# Patient Record
Sex: Female | Born: 1955 | Race: White | Hispanic: No | Smoking: Never smoker
Health system: Southern US, Community
[De-identification: ages and names within clinical notes are randomized; demographics above are authoritative.]

## PROBLEM LIST (undated history)

## (undated) DIAGNOSIS — I2510 Atherosclerotic heart disease of native coronary artery without angina pectoris: Secondary | ICD-10-CM

## (undated) DIAGNOSIS — E785 Hyperlipidemia, unspecified: Secondary | ICD-10-CM

## (undated) HISTORY — DX: Hyperlipidemia, unspecified: E78.5

## (undated) HISTORY — DX: Atherosclerotic heart disease of native coronary artery without angina pectoris: I25.10

## (undated) HISTORY — PX: TUMOR REMOVAL: SHX12

## (undated) HISTORY — PX: TONSILLECTOMY: SHX5217

---

## 1997-11-30 ENCOUNTER — Other Ambulatory Visit: Admission: RE | Admit: 1997-11-30 | Discharge: 1997-11-30 | Payer: Self-pay | Admitting: Obstetrics and Gynecology

## 1998-01-17 ENCOUNTER — Other Ambulatory Visit: Admission: RE | Admit: 1998-01-17 | Discharge: 1998-01-17 | Payer: Self-pay | Admitting: Obstetrics and Gynecology

## 2001-03-30 ENCOUNTER — Encounter: Payer: Self-pay | Admitting: Family Medicine

## 2001-03-30 ENCOUNTER — Encounter: Admission: RE | Admit: 2001-03-30 | Discharge: 2001-03-30 | Payer: Self-pay | Admitting: Family Medicine

## 2004-04-12 ENCOUNTER — Encounter: Admission: RE | Admit: 2004-04-12 | Discharge: 2004-04-12 | Payer: Self-pay | Admitting: Family Medicine

## 2004-04-29 ENCOUNTER — Ambulatory Visit (HOSPITAL_COMMUNITY): Admission: RE | Admit: 2004-04-29 | Discharge: 2004-04-29 | Payer: Self-pay | Admitting: *Deleted

## 2004-04-29 ENCOUNTER — Encounter (INDEPENDENT_AMBULATORY_CARE_PROVIDER_SITE_OTHER): Payer: Self-pay | Admitting: *Deleted

## 2005-07-22 ENCOUNTER — Other Ambulatory Visit: Admission: RE | Admit: 2005-07-22 | Discharge: 2005-07-22 | Payer: Self-pay | Admitting: *Deleted

## 2005-07-29 ENCOUNTER — Encounter: Admission: RE | Admit: 2005-07-29 | Discharge: 2005-07-29 | Payer: Self-pay | Admitting: Family Medicine

## 2005-08-01 ENCOUNTER — Encounter: Admission: RE | Admit: 2005-08-01 | Discharge: 2005-08-01 | Payer: Self-pay | Admitting: Family Medicine

## 2007-03-24 ENCOUNTER — Encounter: Admission: RE | Admit: 2007-03-24 | Discharge: 2007-03-24 | Payer: Self-pay | Admitting: Family Medicine

## 2007-04-30 ENCOUNTER — Encounter: Admission: RE | Admit: 2007-04-30 | Discharge: 2007-04-30 | Payer: Self-pay | Admitting: Family Medicine

## 2008-07-20 ENCOUNTER — Encounter: Admission: RE | Admit: 2008-07-20 | Discharge: 2008-07-20 | Payer: Self-pay | Admitting: Family Medicine

## 2010-08-16 ENCOUNTER — Other Ambulatory Visit: Payer: Self-pay | Admitting: Family Medicine

## 2010-08-16 DIAGNOSIS — Z1231 Encounter for screening mammogram for malignant neoplasm of breast: Secondary | ICD-10-CM

## 2010-08-20 ENCOUNTER — Ambulatory Visit
Admission: RE | Admit: 2010-08-20 | Discharge: 2010-08-20 | Disposition: A | Discharge status: Home / Self Care | Payer: BC Managed Care – PPO | Source: Ambulatory Visit | Attending: Family Medicine | Admitting: Family Medicine

## 2010-08-20 DIAGNOSIS — Z1231 Encounter for screening mammogram for malignant neoplasm of breast: Secondary | ICD-10-CM

## 2011-12-11 ENCOUNTER — Other Ambulatory Visit: Payer: Self-pay | Admitting: Orthopedic Surgery

## 2011-12-11 DIAGNOSIS — M25531 Pain in right wrist: Secondary | ICD-10-CM

## 2011-12-18 ENCOUNTER — Other Ambulatory Visit: Payer: BC Managed Care – PPO

## 2011-12-22 ENCOUNTER — Ambulatory Visit
Admission: RE | Admit: 2011-12-22 | Discharge: 2011-12-22 | Disposition: A | Discharge status: Home / Self Care | Payer: BC Managed Care – PPO | Source: Ambulatory Visit | Attending: Orthopedic Surgery | Admitting: Orthopedic Surgery

## 2011-12-22 DIAGNOSIS — M25531 Pain in right wrist: Secondary | ICD-10-CM

## 2011-12-22 MED ORDER — IOHEXOL 180 MG/ML  SOLN
3.0000 mL | Freq: Once | INTRAMUSCULAR | Status: AC | PRN
  Administered 2011-12-22: 3 mL via INTRA_ARTICULAR

## 2013-12-12 ENCOUNTER — Other Ambulatory Visit: Payer: Self-pay

## 2013-12-12 DIAGNOSIS — Z1231 Encounter for screening mammogram for malignant neoplasm of breast: Secondary | ICD-10-CM

## 2013-12-15 ENCOUNTER — Ambulatory Visit: Payer: BC Managed Care – PPO

## 2014-10-19 ENCOUNTER — Ambulatory Visit
Admission: RE | Admit: 2014-10-19 | Discharge: 2014-10-19 | Disposition: A | Discharge status: Home / Self Care | Payer: BLUE CROSS/BLUE SHIELD | Source: Ambulatory Visit

## 2014-10-19 DIAGNOSIS — Z1231 Encounter for screening mammogram for malignant neoplasm of breast: Secondary | ICD-10-CM

## 2019-08-13 ENCOUNTER — Ambulatory Visit: Payer: BLUE CROSS/BLUE SHIELD

## 2019-08-18 ENCOUNTER — Ambulatory Visit: Payer: BLUE CROSS/BLUE SHIELD

## 2019-08-20 ENCOUNTER — Ambulatory Visit: Payer: BLUE CROSS/BLUE SHIELD

## 2019-08-21 ENCOUNTER — Ambulatory Visit: Payer: Medicare Other | Attending: Internal Medicine

## 2019-08-21 DIAGNOSIS — Z23 Encounter for immunization: Secondary | ICD-10-CM | POA: Insufficient documentation

## 2019-08-21 NOTE — Progress Notes (Signed)
   Covid-19 Vaccination Clinic  Name:  Lori Foley    MRN: [id number] DOB: 01-17-1956  08/21/2019  Lori Foley was observed post Covid-19 immunization for 15 minutes without incidence. She was provided with Vaccine Information Sheet and instruction to access the V-Safe system.   Lori Foley was instructed to call 911 with any severe reactions post vaccine: Marland Kitchen Difficulty breathing  . Swelling of your face and throat  . A fast heartbeat  . A bad rash all over your body  . Dizziness and weakness    Immunizations Administered    Name Date Dose VIS Date Route   Pfizer COVID-19 Vaccine 08/21/2019  8:30 AM 0.3 mL 06/24/2019 Intramuscular   Manufacturer: [employer], [employer]   Lot: [id number]   NDC: [address]-7

## 2019-09-14 ENCOUNTER — Ambulatory Visit: Payer: Medicare Other | Attending: Internal Medicine

## 2019-09-14 DIAGNOSIS — Z23 Encounter for immunization: Secondary | ICD-10-CM | POA: Insufficient documentation

## 2019-09-14 NOTE — Progress Notes (Signed)
   Covid-19 Vaccination Clinic  Name:  ACADIA THAMMAVONG    MRN: [id number] DOB: 10-Jul-1956  09/14/2019  Ms. Mcdowell was observed post Covid-19 immunization for 15 minutes without incident. She was provided with Vaccine Information Sheet and instruction to access the V-Safe system.   Ms. Magar was instructed to call 911 with any severe reactions post vaccine: Marland Kitchen Difficulty breathing  . Swelling of face and throat  . A fast heartbeat  . A bad rash all over body  . Dizziness and weakness   Immunizations Administered    Name Date Dose VIS Date Route   Pfizer COVID-19 Vaccine 09/14/2019  3:39 PM 0.3 mL 06/24/2019 Intramuscular   Manufacturer: [employer], [employer]   Lot: [id number]   NDC: [address]-7

## 2020-05-07 ENCOUNTER — Ambulatory Visit (INDEPENDENT_AMBULATORY_CARE_PROVIDER_SITE_OTHER): Payer: Medicare Other | Admitting: Cardiology

## 2020-05-07 ENCOUNTER — Other Ambulatory Visit: Payer: Self-pay

## 2020-05-07 ENCOUNTER — Encounter: Payer: Self-pay | Admitting: Cardiology

## 2020-05-07 VITALS — BP 130/70 | HR 68 | Ht 63.0 in | Wt 156.0 lb

## 2020-05-07 DIAGNOSIS — R079 Chest pain, unspecified: Secondary | ICD-10-CM | POA: Diagnosis not present

## 2020-05-07 DIAGNOSIS — R072 Precordial pain: Secondary | ICD-10-CM

## 2020-05-07 DIAGNOSIS — R0609 Other forms of dyspnea: Secondary | ICD-10-CM

## 2020-05-07 DIAGNOSIS — R0600 Dyspnea, unspecified: Secondary | ICD-10-CM

## 2020-05-07 DIAGNOSIS — E785 Hyperlipidemia, unspecified: Secondary | ICD-10-CM

## 2020-05-07 HISTORY — DX: Other forms of dyspnea: R06.09

## 2020-05-07 HISTORY — DX: Dyspnea, unspecified: R06.00

## 2020-05-07 HISTORY — DX: Precordial pain: R07.2

## 2020-05-07 HISTORY — DX: Hyperlipidemia, unspecified: E78.5

## 2020-05-07 MED ORDER — METOPROLOL TARTRATE 100 MG PO TABS: 100.0000 mg | ORAL_TABLET | Freq: Once | ORAL | 0 refills | Status: DC

## 2020-05-07 NOTE — Patient Instructions (Addendum)
Medication Instructions:  Your physician recommends that you continue on your current medications as directed. Please refer to the Current Medication list given to you today.  *If you need a refill on your cardiac medications before your next appointment, please call your pharmacy*   Lab Work: Your physician recommends that you return for lab work 3-7 days before ct : bmp   If you have labs (blood work) drawn today and your tests are completely normal, you will receive your results only by: Marland Kitchen MyChart Message (if you have MyChart) OR . A paper copy in the mail If you have any lab test that is abnormal or we need to change your treatment, we will call you to review the results.   Testing/Procedures:  Your physician has requested that you have an echocardiogram. Echocardiography is a painless test that uses sound waves to create images of your heart. It provides your doctor with information about the size and shape of your heart and how well your heart's chambers and valves are working. This procedure takes approximately one hour. There are no restrictions for this procedure.    Your cardiac CT will be scheduled at one of the below locations:   [facility] [address] [place], [place] [address] [phone]  [facility] [address] [place], [place] [address] [phone]  If scheduled at [facility], please arrive at the [facility] main entrance of [facility] 30 minutes prior to test start time. Proceed to the [facility] Radiology Department (first floor) to check-in and test prep.  If scheduled at [facility], please arrive 15 mins early for check-in and test prep.  Please follow these instructions carefully (unless otherwise directed):    On the Night Before the Test: . Be sure to Drink plenty of water. . Do not consume any caffeinated/decaffeinated beverages  or chocolate 12 hours prior to your test. . Do not take any antihistamines 12 hours prior to your test.   On the Day of the Test: . Drink plenty of water. Do not drink any water within one hour of the test. . Do not eat any food 4 hours prior to the test. . You may take your regular medications prior to the test.  . Take metoprolol (Lopressor) two hours prior to test. . FEMALES- please wear underwire-free bra if available         After the Test: . Drink plenty of water. . After receiving IV contrast, you may experience a mild flushed feeling. This is normal. . On occasion, you may experience a mild rash up to 24 hours after the test. This is not dangerous. If this occurs, you can take Benadryl 25 mg and increase your fluid intake. . If you experience trouble breathing, this can be serious. If it is severe call 911 IMMEDIATELY. If it is mild, please call our office. . If you take any of these medications: Glipizide/Metformin, Avandament, Glucavance, please do not take 48 hours after completing test unless otherwise instructed.   Once we have confirmed authorization from your insurance company, we will call you to set up a date and time for your test. Based on how quickly your insurance processes prior authorizations requests, please allow up to 4 weeks to be contacted for scheduling your Cardiac CT appointment. Be advised that routine Cardiac CT appointments could be scheduled as many as 8 weeks after your provider has ordered it.  For  non-scheduling related questions, please contact the cardiac imaging nurse navigator should you have any questions/concerns: Rockwell Alexandria, Cardiac Imaging Nurse Navigator Mitzi Hansen, Interim Cardiac Imaging Nurse Navigator [facility] Direct Office Dial: [phone]   For scheduling needs, including cancellations and rescheduling, please call Sheralyn Boatman at [phone], option [phone].      Follow-Up: At [facility], you and your  health needs are our priority.  As part of our continuing mission to provide you with exceptional heart care, we have created designated Provider Care Teams.  These Care Teams include your primary Cardiologist (physician) and Advanced Practice Providers (APPs -  Physician Assistants and Nurse Practitioners) who all work together to provide you with the care you need, when you need it.  We recommend signing up for the patient portal called "MyChart".  Sign up information is provided on this After Visit Summary.  MyChart is used to connect with patients for Virtual Visits (Telemedicine).  Patients are able to view lab/test results, encounter notes, upcoming appointments, etc.  Non-urgent messages can be sent to your provider as well.   To learn more about what you can do with MyChart, go to [email or web address].    Your next appointment:   6 week(s)  The format for your next appointment:   In Person  Provider:   Gypsy Balsam, MD   Other Instructions   Echocardiogram An echocardiogram is a procedure that uses painless sound waves (ultrasound) to produce an image of the heart. Images from an echocardiogram can provide important information about:  Signs of coronary artery disease (CAD).  Aneurysm detection. An aneurysm is a weak or damaged part of an artery wall that bulges out from the normal force of blood pumping through the body.  Heart size and shape. Changes in the size or shape of the heart can be associated with certain conditions, including heart failure, aneurysm, and CAD.  Heart muscle function.  Heart valve function.  Signs of a past heart attack.  Fluid buildup around the heart.  Thickening of the heart muscle.  A tumor or infectious growth around the heart valves. Tell a health care provider about:  Any allergies you have.  All medicines you are taking, including vitamins, herbs, eye drops, creams, and over-the-counter medicines.  Any blood disorders you  have.  Any surgeries you have had.  Any medical conditions you have.  Whether you are pregnant or may be pregnant. What are the risks? Generally, this is a safe procedure. However, problems may occur, including:  Allergic reaction to dye (contrast) that may be used during the procedure. What happens before the procedure? No specific preparation is needed. You may eat and drink normally. What happens during the procedure?   An IV tube may be inserted into one of your veins.  You may receive contrast through this tube. A contrast is an injection that improves the quality of the pictures from your heart.  A gel will be applied to your chest.  A wand-like tool (transducer) will be moved over your chest. The gel will help to transmit the sound waves from the transducer.  The sound waves will harmlessly bounce off of your heart to allow the heart images to be captured in real-time motion. The images will be recorded on a computer. The procedure may vary among health care providers and hospitals. What happens after the procedure?  You may return to your normal, everyday life, including diet, activities, and medicines, unless your health care provider tells you  not to do that. Summary  An echocardiogram is a procedure that uses painless sound waves (ultrasound) to produce an image of the heart.  Images from an echocardiogram can provide important information about the size and shape of your heart, heart muscle function, heart valve function, and fluid buildup around your heart.  You do not need to do anything to prepare before this procedure. You may eat and drink normally.  After the echocardiogram is completed, you may return to your normal, everyday life, unless your health care provider tells you not to do that. This information is not intended to replace advice given to you by your health care provider. Make sure you discuss any questions you have with your health care  provider. Document Revised: 10/21/2018 Document Reviewed: 08/02/2016 Elsevier Patient Education  [place].    Cardiac CT Angiogram A cardiac CT angiogram is a procedure to look at the heart and the area around the heart. It may be done to help find the cause of chest pains or other symptoms of heart disease. During this procedure, a substance called contrast dye is injected into the blood vessels in the area to be checked. A large X-ray machine, called a CT scanner, then takes detailed pictures of the heart and the surrounding area. The procedure is also sometimes called a coronary CT angiogram, coronary artery scanning, or CTA. A cardiac CT angiogram allows the health care provider to see how well blood is flowing to and from the heart. The health care provider will be able to see if there are any problems, such as:  Blockage or narrowing of the coronary arteries in the heart.  Fluid around the heart.  Signs of weakness or disease in the muscles, valves, and tissues of the heart. Tell a health care provider about:  Any allergies you have. This is especially important if you have had a previous allergic reaction to contrast dye.  All medicines you are taking, including vitamins, herbs, eye drops, creams, and over-the-counter medicines.  Any blood disorders you have.  Any surgeries you have had.  Any medical conditions you have.  Whether you are pregnant or may be pregnant.  Any anxiety disorders, chronic pain, or other conditions you have that may increase your stress or prevent you from lying still. What are the risks? Generally, this is a safe procedure. However, problems may occur, including:  Bleeding.  Infection.  Allergic reactions to medicines or dyes.  Damage to other structures or organs.  Kidney damage from the contrast dye that is used.  Increased risk of cancer from radiation exposure. This risk is low. Talk with your health care provider about: ? The  risks and benefits of testing. ? How you can receive the lowest dose of radiation. What happens before the procedure?  Wear comfortable clothing and remove any jewelry, glasses, dentures, and hearing aids.  Follow instructions from your health care provider about eating and drinking. This may include: ? For 12 hours before the procedure -- avoid caffeine. This includes tea, coffee, soda, energy drinks, and diet pills. Drink plenty of water or other fluids that do not have caffeine in them. Being well hydrated can prevent complications. ? For 4-6 hours before the procedure -- stop eating and drinking. The contrast dye can cause nausea, but this is less likely if your stomach is empty.  Ask your health care provider about changing or stopping your regular medicines. This is especially important if you are taking diabetes medicines, blood thinners,  or medicines to treat problems with erections (erectile dysfunction). What happens during the procedure?   Hair on your chest may need to be removed so that small sticky patches called electrodes can be placed on your chest. These will transmit information that helps to monitor your heart during the procedure.  An IV will be inserted into one of your veins.  You might be given a medicine to control your heart rate during the procedure. This will help to ensure that good images are obtained.  You will be asked to lie on an exam table. This table will slide in and out of the CT machine during the procedure.  Contrast dye will be injected into the IV. You might feel warm, or you may get a metallic taste in your mouth.  You will be given a medicine called nitroglycerin. This will relax or dilate the arteries in your heart.  The table that you are lying on will move into the CT machine tunnel for the scan.  The person running the machine will give you instructions while the scans are being done. You may be asked to: ? Keep your arms above your  head. ? Hold your breath. ? Stay very still, even if the table is moving.  When the scanning is complete, you will be moved out of the machine.  The IV will be removed. The procedure may vary among health care providers and hospitals. What can I expect after the procedure? After your procedure, it is common to have:  A metallic taste in your mouth from the contrast dye.  A feeling of warmth.  A headache from the nitroglycerin. Follow these instructions at home:  Take over-the-counter and prescription medicines only as told by your health care provider.  If you are told, drink enough fluid to keep your urine pale yellow. This will help to flush the contrast dye out of your body.  Most people can return to their normal activities right after the procedure. Ask your health care provider what activities are safe for you.  It is up to you to get the results of your procedure. Ask your health care provider, or the department that is doing the procedure, when your results will be ready.  Keep all follow-up visits as told by your health care provider. This is important. Contact a health care provider if:  You have any symptoms of allergy to the contrast dye. These include: ? Shortness of breath. ? Rash or hives. ? A racing heartbeat. Summary  A cardiac CT angiogram is a procedure to look at the heart and the area around the heart. It may be done to help find the cause of chest pains or other symptoms of heart disease.  During this procedure, a large X-ray machine, called a CT scanner, takes detailed pictures of the heart and the surrounding area after a contrast dye has been injected into blood vessels in the area.  Ask your health care provider about changing or stopping your regular medicines before the procedure. This is especially important if you are taking diabetes medicines, blood thinners, or medicines to treat erectile dysfunction.  If you are told, drink enough fluid to  keep your urine pale yellow. This will help to flush the contrast dye out of your body. This information is not intended to replace advice given to you by your health care provider. Make sure you discuss any questions you have with your health care provider. Document Revised: 02/23/2019 Document Reviewed: 02/23/2019 Elsevier Patient  Education  [employer].

## 2020-05-07 NOTE — Addendum Note (Signed)
Addended by: Hazle Quant on: 05/07/2020 09:38 AM   Modules accepted: Orders

## 2020-05-07 NOTE — Progress Notes (Signed)
Cardiology Consultation:    Date:  05/07/2020   ID:  Lori, Foley March 20, 1956, MRN [id number]  PCP:  Sigmund Hazel, MD  Cardiologist:  Gypsy Balsam, MD   Referring MD: No ref. provider found   No chief complaint on file. I have chest pain  History of Present Illness:    Lori Foley is a 64 y.o. female who is being seen today for the evaluation of chest pain at the request of No ref. provider found.  For years she experienced chest pain.  She loves to work in the garden when she goes there into some bushes of cleaning leaves after about an hour of doing this history becomes very weak tired exhausted and also described to have some tightness in the chest.  She has to go back home and relax about 15 minutes later she is ready to come back home the second time when she goes back she usually can work for about half an hour and the same standing happen when she goes back home.  This been going on for years but finally she got tired of this and she would like to know exactly what the problem is.  Interesting is the fact that she likes to walk on the treadmill she usually stay on the treadmill for about half an hour at a speed of 3 mph and she has absolutely no difficulty doing it.  With this sensation while she walks in the garden she does sweat some but she sweats because she works.  She also developed some shortness of breath but again she works physically so is difficult to judge.  She does have mild dyslipidemia, she never smoked.  She does not have hypertension diabetes.  She does not have family history of premature coronary artery disease.  So she does not have many risk factors for having heart issues.  Regardless symptoms are concerning need to be investigated.  Past Medical History:  Diagnosis Date  . Hyperlipidemia     Past Surgical History:  Procedure Laterality Date  . TONSILLECTOMY    . TUMOR REMOVAL Right    RT HAND    Current Medications: Current Meds  Medication  Sig  . Magnesium 250 MG TABS Take 1 tablet by mouth daily.  . Multiple Vitamins-Minerals (CENTRUM SILVER ADULT 50+) TABS Take 1 tablet by mouth daily.  . vitamin C (ASCORBIC ACID) 250 MG tablet Take 250 mg by mouth daily.     Allergies:   Patient has no known allergies.   Social History   Socioeconomic History  . Marital status: Married    Spouse name: Not on file  . Number of children: Not on file  . Years of education: Not on file  . Highest education level: Not on file  Occupational History  . Not on file  Tobacco Use  . Smoking status: Never Smoker  . Smokeless tobacco: Never Used  Vaping Use  . Vaping Use: Never used  Substance and Sexual Activity  . Alcohol use: Yes    Comment: 1 - 2  weekly  . Drug use: Never  . Sexual activity: Not on file  Other Topics Concern  . Not on file  Social History Narrative  . Not on file   Social Determinants of Health   Financial Resource Strain:   . Difficulty of Paying Living Expenses: Not on file  Food Insecurity:   . Worried About Programme researcher, broadcasting/film/video in the Last Year: Not on file  .  Ran Out of Food in the Last Year: Not on file  Transportation Needs:   . Lack of Transportation (Medical): Not on file  . Lack of Transportation (Non-Medical): Not on file  Physical Activity:   . Days of Exercise per Week: Not on file  . Minutes of Exercise per Session: Not on file  Stress:   . Feeling of Stress : Not on file  Social Connections:   . Frequency of Communication with Friends and Family: Not on file  . Frequency of Social Gatherings with Friends and Family: Not on file  . Attends Religious Services: Not on file  . Active Member of Clubs or Organizations: Not on file  . Attends Banker Meetings: Not on file  . Marital Status: Not on file     Family History: The patient's family history includes CAD in her half-brother; Heart attack in her father; Throat cancer in her mother. ROS:   Please see the history of  present illness.    All 14 point review of systems negative except as described per history of present illness.  EKGs/Labs/Other Studies Reviewed:    The following studies were reviewed today:   EKG:  EKG is  ordered today.  The ekg ordered today demonstrates EKG showed normal sinus rhythm normal PR interval normal QS complex duration morphology no ST segment changes.  Recent Labs: No results found for requested labs within last 8760 hours.  Recent Lipid Panel No results found for: CHOL, TRIG, HDL, CHOLHDL, VLDL, LDLCALC, LDLDIRECT  Physical Exam:    VS:  BP 130/70   Pulse 68   Ht 5\' 3"  (1.6 m)   Wt 156 lb (70.8 kg)   SpO2 98%   BMI 27.63 kg/m     Wt Readings from Last 3 Encounters:  05/07/20 156 lb (70.8 kg)     GEN:  Well nourished, well developed in no acute distress HEENT: Normal NECK: No JVD; No carotid bruits LYMPHATICS: No lymphadenopathy CARDIAC: RRR, no murmurs, no rubs, no gallops RESPIRATORY:  Clear to auscultation without rales, wheezing or rhonchi  ABDOMEN: Soft, non-tender, non-distended MUSCULOSKELETAL:  No edema; No deformity  SKIN: Warm and dry NEUROLOGIC:  Alert and oriented x 3 PSYCHIATRIC:  Normal affect   ASSESSMENT:    1. Precordial chest pain   2. Dyspnea on exertion   3. Dyslipidemia    PLAN:    In order of problems listed above:  1. Precordial chest pain with some characteristics that are worrisome however lately does not have any risk factors for coronary artery disease except mild dyslipidemia, her predicted calculated 10 years risk for coronary event is 5.8% which is considered borderline.  I still think however that need to be investigated.  I think the best approach will be to do echocardiogram to make sure her left ventricle ejection fraction is preserved, I also suggest to have coronary CT angio to rule out possibility of significant coronary artery disease.  In the meantime ask you to start taking 1 baby aspirin every single day.  I  do not think there is any role for antianginal therapy at this stage since she had very rare episode of chest pain.  I told her not to push herself too hard to the point that she reads symptoms. 2. Dyspnea on exertion: It will be investigated by doing stress test as well as echocardiogram. 3. Dyslipidemia again I calculated her 10 years risk for coronary event which came as 5.8 that is considered borderline.  We will make a decision regarding therapy based on results of her coronary CT angio and echocardiogram.  Her LDL was 122 and HDL was 83.   Medication Adjustments/Labs and Tests Ordered: Current medicines are reviewed at length with the patient today.  Concerns regarding medicines are outlined above.  No orders of the defined types were placed in this encounter.  No orders of the defined types were placed in this encounter.   Signed, Georgeanna Lea, MD, [facility]. 05/07/2020 9:17 AM    [facility]

## 2020-05-23 ENCOUNTER — Ambulatory Visit (HOSPITAL_BASED_OUTPATIENT_CLINIC_OR_DEPARTMENT_OTHER): Payer: Medicare Other

## 2020-06-15 ENCOUNTER — Other Ambulatory Visit: Payer: Self-pay

## 2020-06-15 ENCOUNTER — Ambulatory Visit (HOSPITAL_BASED_OUTPATIENT_CLINIC_OR_DEPARTMENT_OTHER)
Admission: RE | Admit: 2020-06-15 | Discharge: 2020-06-15 | Disposition: A | Discharge status: Home / Self Care | Payer: Medicare Other | Source: Ambulatory Visit | Attending: Cardiology | Admitting: Cardiology

## 2020-06-15 DIAGNOSIS — E785 Hyperlipidemia, unspecified: Secondary | ICD-10-CM | POA: Diagnosis not present

## 2020-06-15 DIAGNOSIS — R0600 Dyspnea, unspecified: Secondary | ICD-10-CM | POA: Insufficient documentation

## 2020-06-15 DIAGNOSIS — R072 Precordial pain: Secondary | ICD-10-CM

## 2020-06-15 DIAGNOSIS — R0609 Other forms of dyspnea: Secondary | ICD-10-CM

## 2020-06-15 LAB — ECHOCARDIOGRAM COMPLETE
Area-P 1/2: 2.76 cm2
S' Lateral: 2.74 cm

## 2020-06-19 ENCOUNTER — Telehealth: Payer: Self-pay | Admitting: Cardiology

## 2020-06-19 NOTE — Telephone Encounter (Signed)
Patient informed of results.  

## 2020-06-19 NOTE — Telephone Encounter (Signed)
Pt called in returning [facility] call  About her echo results    Best number   [phone]

## 2020-06-22 LAB — BASIC METABOLIC PANEL
BUN/Creatinine Ratio: 19 (ref 12–28)
BUN: 14 mg/dL (ref 8–27)
CO2: 24 mmol/L (ref 20–29)
Calcium: 9.6 mg/dL (ref 8.7–10.3)
Chloride: 104 mmol/L (ref 96–106)
Creatinine, Ser: 0.74 mg/dL (ref 0.57–1.00)
GFR calc Af Amer: 98 mL/min/{1.73_m2} (ref >59)
GFR calc non Af Amer: 85 mL/min/{1.73_m2} (ref >59)
Glucose: 78 mg/dL (ref 65–99)
Potassium: 5.1 mmol/L (ref 3.5–5.2)
Sodium: 143 mmol/L (ref 134–144)

## 2020-06-26 ENCOUNTER — Telehealth (HOSPITAL_COMMUNITY): Payer: Self-pay | Admitting: Emergency Medicine

## 2020-06-26 NOTE — Telephone Encounter (Signed)
Reaching out to patient to offer assistance regarding upcoming cardiac imaging study; pt verbalizes understanding of appt date/time, parking situation and where to check in, pre-test NPO status and medications ordered, and verified current allergies; name and call back number provided for further questions should they arise Lori Norfleet RN Navigator Cardiac Imaging [facility] [phone] office [phone] cell  100mg metoprolol tartrate 2 hr prior to scan Lori Foley  

## 2020-06-27 ENCOUNTER — Ambulatory Visit: Payer: Medicare Other | Admitting: Cardiology

## 2020-06-28 ENCOUNTER — Other Ambulatory Visit: Payer: Self-pay

## 2020-06-28 ENCOUNTER — Ambulatory Visit (INDEPENDENT_AMBULATORY_CARE_PROVIDER_SITE_OTHER): Payer: Medicare Other | Admitting: Podiatry

## 2020-06-28 ENCOUNTER — Ambulatory Visit (HOSPITAL_COMMUNITY)
Admission: RE | Admit: 2020-06-28 | Discharge: 2020-06-28 | Disposition: A | Discharge status: Home / Self Care | Payer: Medicare Other | Source: Ambulatory Visit | Attending: Cardiology | Admitting: Cardiology

## 2020-06-28 DIAGNOSIS — R072 Precordial pain: Secondary | ICD-10-CM

## 2020-06-28 DIAGNOSIS — E785 Hyperlipidemia, unspecified: Secondary | ICD-10-CM | POA: Diagnosis present

## 2020-06-28 DIAGNOSIS — R0609 Other forms of dyspnea: Secondary | ICD-10-CM

## 2020-06-28 DIAGNOSIS — B351 Tinea unguium: Secondary | ICD-10-CM

## 2020-06-28 DIAGNOSIS — M79674 Pain in right toe(s): Secondary | ICD-10-CM | POA: Diagnosis not present

## 2020-06-28 DIAGNOSIS — G8929 Other chronic pain: Secondary | ICD-10-CM | POA: Diagnosis not present

## 2020-06-28 DIAGNOSIS — R079 Chest pain, unspecified: Secondary | ICD-10-CM

## 2020-06-28 DIAGNOSIS — R0600 Dyspnea, unspecified: Secondary | ICD-10-CM | POA: Insufficient documentation

## 2020-06-28 MED ORDER — IOHEXOL 350 MG/ML SOLN
80.0000 mL | Freq: Once | INTRAVENOUS | Status: AC | PRN
  Administered 2020-06-28: 80 mL via INTRAVENOUS

## 2020-06-28 MED ORDER — NITROGLYCERIN 0.4 MG SL SUBL
SUBLINGUAL_TABLET | SUBLINGUAL | Status: AC
  Administered 2020-06-28: 0.4 mg via SUBLINGUAL
  Filled 2020-06-28: qty 1

## 2020-06-28 MED ORDER — NITROGLYCERIN 0.4 MG SL SUBL: 0.4000 mg | SUBLINGUAL_TABLET | Freq: Once | SUBLINGUAL | Status: AC

## 2020-06-28 NOTE — Patient Instructions (Signed)
I have ordered a medication for you that will come from [facility] in [place]. They should be calling you to verify insurance and will mail the medication to you. If you live close by then you can go by their pharmacy to pick up the medication. Their phone number is [phone]. If you do not hear from them in the next few days, please give us a call at [phone].   

## 2020-06-30 NOTE — Progress Notes (Signed)
Subjective:   Patient ID: Lori Foley, female   DOB: 64 y.o.   MRN: [id number]   HPI 64 year old female presents the office today for concerns of right big toenail discoloration and occasional pressure to the nail.  Currently denies any pain.  Denies any redness or drainage or any swelling.  This is been ongoing for last 8 months.  She has no treatment.  No other concerns.   Review of Systems  All other systems reviewed and are negative.  Past Medical History:  Diagnosis Date  . Hyperlipidemia     Past Surgical History:  Procedure Laterality Date  . TONSILLECTOMY    . TUMOR REMOVAL Right    RT HAND     Current Outpatient Medications:  .  BABY ASPIRIN PO, , Disp: , Rfl:  .  Magnesium 250 MG TABS, Take 1 tablet by mouth daily., Disp: , Rfl:  .  metoprolol tartrate (LOPRESSOR) 100 MG tablet, Take 1 tablet (100 mg total) by mouth once for 1 dose. 2 hours before ct., Disp: 1 tablet, Rfl: 0 .  Multiple Vitamins-Minerals (CENTRUM SILVER ADULT 50+) TABS, Take 1 tablet by mouth daily., Disp: , Rfl:  .  vitamin C (ASCORBIC ACID) 250 MG tablet, Take 250 mg by mouth daily., Disp: , Rfl:   No Known Allergies        Objective:  Physical Exam  General: AAO x3, NAD  Dermatological: Right hallux toenail has mild brown discoloration and hypertrophy of the nail.  There is no pain the nail there is no evidence of drainage or any signs of infection noted today.  There is no hyperpigmentation into the nail or surrounding skin.  There is no open lesions.  Vascular: Dorsalis Pedis artery and Posterior Tibial artery pedal pulses are 2/4 bilateral with immedate capillary fill time.  There is no pain with calf compression, swelling, warmth, erythema.   Neruologic: Grossly intact via light touch bilateral.   Musculoskeletal: No gross boney pedal deformities bilateral. No pain, crepitus, or limitation noted with foot and ankle range of motion bilateral. Muscular strength 5/5 in all groups tested  bilateral.  Gait: Unassisted, Nonantalgic.       Assessment:   Onychomycosis right hallux toenail    Plan:  -Treatment options discussed including all alternatives, risks, and complications -Etiology of symptoms were discussed -We discussed multiple treatment options both conservative as well as surgical for the nail.  We discussed oral, topical medicines as well as nail removal.  She still can have the nail removed.  However after discussion running try topical medications the next couple of months and there is improvement will continue with this and if not we will remove the nail next appointment. She is OK with permanent removal of the toenail.  Vivi Barrack DPM

## 2020-07-05 ENCOUNTER — Other Ambulatory Visit: Payer: Self-pay | Admitting: Family Medicine

## 2020-07-05 DIAGNOSIS — Z1231 Encounter for screening mammogram for malignant neoplasm of breast: Secondary | ICD-10-CM

## 2020-07-05 DIAGNOSIS — Z780 Asymptomatic menopausal state: Secondary | ICD-10-CM

## 2020-07-19 ENCOUNTER — Other Ambulatory Visit: Payer: Self-pay

## 2020-07-19 DIAGNOSIS — E663 Overweight: Secondary | ICD-10-CM

## 2020-07-19 DIAGNOSIS — E785 Hyperlipidemia, unspecified: Secondary | ICD-10-CM | POA: Insufficient documentation

## 2020-07-19 HISTORY — DX: Overweight: E66.3

## 2020-07-23 ENCOUNTER — Other Ambulatory Visit: Payer: Self-pay

## 2020-07-23 ENCOUNTER — Ambulatory Visit (INDEPENDENT_AMBULATORY_CARE_PROVIDER_SITE_OTHER): Payer: Medicare Other | Admitting: Cardiology

## 2020-07-23 ENCOUNTER — Encounter: Payer: Self-pay | Admitting: Cardiology

## 2020-07-23 VITALS — BP 118/70 | HR 69 | Ht 62.0 in | Wt 152.0 lb

## 2020-07-23 DIAGNOSIS — R0609 Other forms of dyspnea: Secondary | ICD-10-CM

## 2020-07-23 DIAGNOSIS — R072 Precordial pain: Secondary | ICD-10-CM | POA: Diagnosis not present

## 2020-07-23 DIAGNOSIS — E782 Mixed hyperlipidemia: Secondary | ICD-10-CM

## 2020-07-23 DIAGNOSIS — R0600 Dyspnea, unspecified: Secondary | ICD-10-CM

## 2020-07-23 NOTE — Patient Instructions (Signed)

## 2020-07-23 NOTE — Progress Notes (Signed)
Cardiology Office Note:    Date:  07/23/2020   ID:  Rafia, Shedden 21-Aug-1955, MRN [id number]  PCP:  Sigmund Hazel, MD  Cardiologist:  Gypsy Balsam, MD    Referring MD: Sigmund Hazel, MD   Chief Complaint  Patient presents with  . Follow-up  Am doing fine  History of Present Illness:    Lori Foley is a 65 y.o. female with past medical history significant for dyslipidemia, she was referred to [place] because of precordial chest pain as well as dyspnea on exertion.  Pain happened when she was working very hard and heavy.  At the same time she is able to walk on the treadmill for about half an hour at 3 mph episode have no difficulty doing it.  She comes today 2 months for follow-up.  Overall doing well.  Denies have any chest pain tightness squeezing pressure burning chest but she does not push herself.  She enjoyed Christmas and holiday time.  Past Medical History:  Diagnosis Date  . Dyslipidemia 05/07/2020  . Dyspnea on exertion 05/07/2020  . Hyperlipidemia   . Overweight 07/19/2020  . Precordial chest pain 05/07/2020    Past Surgical History:  Procedure Laterality Date  . TONSILLECTOMY    . TUMOR REMOVAL Right    RT HAND    Current Medications: Current Meds  Medication Sig  . BABY ASPIRIN PO   . Magnesium 250 MG TABS Take 1 tablet by mouth daily.  . Multiple Vitamins-Minerals (CENTRUM SILVER ADULT 50+) TABS Take 1 tablet by mouth daily.  Marland Kitchen terbinafine (LAMISIL) 250 MG tablet Take 250 mg by mouth daily.  . vitamin C (ASCORBIC ACID) 250 MG tablet Take 250 mg by mouth daily.     Allergies:   Patient has no known allergies.   Social History   Socioeconomic History  . Marital status: Married    Spouse name: Not on file  . Number of children: Not on file  . Years of education: Not on file  . Highest education level: Not on file  Occupational History  . Not on file  Tobacco Use  . Smoking status: Never Smoker  . Smokeless tobacco: Never Used  Vaping Use  .  Vaping Use: Never used  Substance and Sexual Activity  . Alcohol use: Yes    Comment: 1 - 2  weekly  . Drug use: Never  . Sexual activity: Not on file  Other Topics Concern  . Not on file  Social History Narrative  . Not on file   Social Determinants of Health   Financial Resource Strain: Not on file  Food Insecurity: Not on file  Transportation Needs: Not on file  Physical Activity: Not on file  Stress: Not on file  Social Connections: Not on file     Family History: The patient's family history includes CAD in her half-brother; Heart attack in her father; Throat cancer in her mother. ROS:   Please see the history of present illness.    All 14 point review of systems negative except as described per history of present illness  EKGs/Labs/Other Studies Reviewed:      Recent Labs: 06/21/2020: BUN 14; Creatinine, Ser 0.74; Potassium 5.1; Sodium 143  Recent Lipid Panel No results found for: CHOL, TRIG, HDL, CHOLHDL, VLDL, LDLCALC, LDLDIRECT  Physical Exam:    VS:  BP 118/70 (BP Location: Right Arm, Patient Position: Sitting)   Pulse 69   Ht 5\' 2"  (1.575 m)   Wt 152 lb (68.9  kg)   SpO2 95%   BMI 27.80 kg/m     Wt Readings from Last 3 Encounters:  07/23/20 152 lb (68.9 kg)  05/07/20 156 lb (70.8 kg)     GEN:  Well nourished, well developed in no acute distress HEENT: Normal NECK: No JVD; No carotid bruits LYMPHATICS: No lymphadenopathy CARDIAC: RRR, no murmurs, no rubs, no gallops RESPIRATORY:  Clear to auscultation without rales, wheezing or rhonchi  ABDOMEN: Soft, non-tender, non-distended MUSCULOSKELETAL:  No edema; No deformity  SKIN: Warm and dry LOWER EXTREMITIES: no swelling NEUROLOGIC:  Alert and oriented x 3 PSYCHIATRIC:  Normal affect   ASSESSMENT:    1. Precordial chest pain   2. Mixed hyperlipidemia   3. Dyspnea on exertion    PLAN:    In order of problems listed above:  1. Precordial chest pain.  Quite extensive evaluation has been done  which included coronary sitting on chair revealing a calcium score of 0 and no significant coronary artery disease.  Therefore, I decided where she will concentrate on risk factors modifications.  We did talk about healthy lifestyle and need to exercise on a regular basis, Mediterranean diet which she is trying to comply with.  I told her if she has more chest pain she needs to let me know. 2. Mixed dyslipidemia last time we calculated her 10 years predicted risk which was only borderline.  Now with the situational calcium score is 0 I think we can skip taking statin however she needs to exercise and stick with a good diet. 3. Dyspnea on exertion multifactorial encouraged her to be a little more active.  I did review echocardiogram with her which did not show any significant abnormalities. 4. Overall all testing came negative.  I do not see any significant heart problems calcium score 0 coronary CT angio showing nonobstructive disease, echocardiogram normal ejection fraction.  He is risk factors modification which includes exercises on a regular basis and we did discuss this 5 times a week for at least half an hour moderate intensity exercise as well as with discussed basic of Mediterranean diet.  I did review her K PN which show me her LDL is 122 and HDL 83 we will continue as above.   Medication Adjustments/Labs and Tests Ordered: Current medicines are reviewed at length with the patient today.  Concerns regarding medicines are outlined above.  No orders of the defined types were placed in this encounter.  Medication changes: No orders of the defined types were placed in this encounter.   Signed, Georgeanna Lea, MD, [facility] 07/23/2020 8:27 AM    [facility]

## 2020-10-01 ENCOUNTER — Ambulatory Visit (INDEPENDENT_AMBULATORY_CARE_PROVIDER_SITE_OTHER): Payer: Medicare Other | Admitting: Podiatry

## 2020-10-01 ENCOUNTER — Other Ambulatory Visit: Payer: Self-pay

## 2020-10-01 DIAGNOSIS — B351 Tinea unguium: Secondary | ICD-10-CM | POA: Diagnosis not present

## 2020-10-01 NOTE — Patient Instructions (Signed)
Continue topical medication for the nail

## 2020-10-03 DIAGNOSIS — B351 Tinea unguium: Secondary | ICD-10-CM | POA: Insufficient documentation

## 2020-10-03 NOTE — Progress Notes (Signed)
Subjective: 65 year old female presents the office today for follow-up evaluation of right hallux toenail discoloration, onychomycosis.  She is continue with topical treatment she states that overall doing much better.  She has no pain in the nail she denies any redness or drainage or any swelling.  Get some occasional slight pressure but not significant.  She has no other concerns. Denies any systemic complaints such as fevers, chills, nausea, vomiting. No acute changes since last appointment, and no other complaints at this time.   Objective: AAO x3, NAD DP/PT pulses palpable bilaterally, CRT less than 3 seconds The right hallux toenail is mildly hypertrophic, dystrophic but overall appears to be improving color.  There is no significant discomfort identified today there is no edema, erythema or any signs of infection. No pain with calf compression, swelling, warmth, erythema  Assessment: Right hallux onychomycosis, currently on topical treatment  Plan: -All treatment options discussed with the patient including all alternatives, risks, complications.  -Since she is doing better we will hold off on nail removal.  Continue the topical ointment.  Discussed duration of use.  If needed we can always remove the nail however since she is doing better would hold off on this. -Patient encouraged to call the office with any questions, concerns, change in symptoms.   Vivi Barrack DPM

## 2020-10-17 ENCOUNTER — Other Ambulatory Visit: Payer: Self-pay | Admitting: Family Medicine

## 2020-10-17 DIAGNOSIS — Z780 Asymptomatic menopausal state: Secondary | ICD-10-CM

## 2020-10-19 ENCOUNTER — Other Ambulatory Visit: Payer: Medicare Other

## 2020-10-19 ENCOUNTER — Ambulatory Visit
Admission: RE | Admit: 2020-10-19 | Discharge: 2020-10-19 | Disposition: A | Discharge status: Home / Self Care | Payer: Medicare Other | Source: Ambulatory Visit | Attending: Family Medicine | Admitting: Family Medicine

## 2020-10-19 ENCOUNTER — Other Ambulatory Visit: Payer: Self-pay

## 2020-10-19 DIAGNOSIS — Z1231 Encounter for screening mammogram for malignant neoplasm of breast: Secondary | ICD-10-CM

## 2021-04-01 ENCOUNTER — Ambulatory Visit
Admission: RE | Admit: 2021-04-01 | Discharge: 2021-04-01 | Disposition: A | Discharge status: Home / Self Care | Payer: Medicare Other | Source: Ambulatory Visit | Attending: Family Medicine | Admitting: Family Medicine

## 2021-04-01 ENCOUNTER — Other Ambulatory Visit: Payer: Self-pay

## 2021-04-01 DIAGNOSIS — Z780 Asymptomatic menopausal state: Secondary | ICD-10-CM

## 2021-04-18 ENCOUNTER — Ambulatory Visit: Payer: Medicare Other | Admitting: Cardiology

## 2021-05-16 ENCOUNTER — Other Ambulatory Visit: Payer: Self-pay

## 2021-05-20 ENCOUNTER — Other Ambulatory Visit: Payer: Self-pay

## 2021-05-20 ENCOUNTER — Ambulatory Visit (INDEPENDENT_AMBULATORY_CARE_PROVIDER_SITE_OTHER): Payer: Medicare Other | Admitting: Cardiology

## 2021-05-20 ENCOUNTER — Encounter: Payer: Self-pay | Admitting: Cardiology

## 2021-05-20 VITALS — BP 120/72 | HR 78 | Ht 63.0 in | Wt 147.0 lb

## 2021-05-20 DIAGNOSIS — R0789 Other chest pain: Secondary | ICD-10-CM | POA: Diagnosis not present

## 2021-05-20 DIAGNOSIS — E785 Hyperlipidemia, unspecified: Secondary | ICD-10-CM | POA: Diagnosis not present

## 2021-05-20 DIAGNOSIS — R0609 Other forms of dyspnea: Secondary | ICD-10-CM

## 2021-05-20 DIAGNOSIS — E782 Mixed hyperlipidemia: Secondary | ICD-10-CM

## 2021-05-20 HISTORY — DX: Other chest pain: R07.89

## 2021-05-20 NOTE — Patient Instructions (Signed)
Medication Instructions:  Your physician recommends that you continue on your current medications as directed. Please refer to the Current Medication list given to you today.  *If you need a refill on your cardiac medications before your next appointment, please call your pharmacy*   Lab Work: None  If you have labs (blood work) drawn today and your tests are completely normal, you will receive your results only by: MyChart Message (if you have MyChart) OR A paper copy in the mail If you have any lab test that is abnormal or we need to change your treatment, we will call you to review the results.   Testing/Procedures: None    Follow-Up: At [facility], you and your health needs are our priority.  As part of our continuing mission to provide you with exceptional heart care, we have created designated Provider Care Teams.  These Care Teams include your primary Cardiologist (physician) and Advanced Practice Providers (APPs -  Physician Assistants and Nurse Practitioners) who all work together to provide you with the care you need, when you need it.  We recommend signing up for the patient portal called "MyChart".  Sign up information is provided on this After Visit Summary.  MyChart is used to connect with patients for Virtual Visits (Telemedicine).  Patients are able to view lab/test results, encounter notes, upcoming appointments, etc.  Non-urgent messages can be sent to your provider as well.   To learn more about what you can do with MyChart, go to [email or web address].    Your next appointment:   As needed     The format for your next appointment:   In Person  Provider:   Robert Krasowski, MD   Other Instructions   

## 2021-05-20 NOTE — Progress Notes (Signed)
Cardiology Office Note:    Date:  05/20/2021   ID:  Lori Foley, DOB 19-Jan-1956, MRN [id number]  PCP:  Sigmund Hazel, MD  Cardiologist:  Gypsy Balsam, MD    Referring MD: Sigmund Hazel, MD   Chief Complaint  Patient presents with   Follow-up  Am doing very well  History of Present Illness:    Lori Foley is a 65 y.o. female she was referred to [place] because of atypical chest pain.  Evaluation included coronary calcium score which was 0, she also got dyslipidemia as well as dyspnea on exertion, echocardiogram at that time performed showed preserved left ventricle ejection fraction mild to moderate TR, everything else looked same.   She comes today to my office for follow-up.  Overall she is doing very well.  She exercised on the regular basis she had no difficulty doing it.  Denies have any chest pain tightness squeezing pressure burning chest.  Overall she is feeling well.  Past Medical History:  Diagnosis Date   Dyslipidemia 05/07/2020   Dyspnea on exertion 05/07/2020   Hyperlipidemia    Overweight 07/19/2020   Precordial chest pain 05/07/2020    Past Surgical History:  Procedure Laterality Date   TONSILLECTOMY     TUMOR REMOVAL Right    RT HAND    Current Medications: Current Meds  Medication Sig   Magnesium 250 MG TABS Take 1 tablet by mouth daily.   Multiple Vitamins-Minerals (CENTRUM SILVER ADULT 50+) TABS Take 1 tablet by mouth daily. Unknown strength   vitamin C (ASCORBIC ACID) 250 MG tablet Take 250 mg by mouth daily.     Allergies:   Patient has no known allergies.   Social History   Socioeconomic History   Marital status: Married    Spouse name: Not on file   Number of children: Not on file   Years of education: Not on file   Highest education level: Not on file  Occupational History   Not on file  Tobacco Use   Smoking status: Never   Smokeless tobacco: Never  Vaping Use   Vaping Use: Never used  Substance and Sexual Activity   Alcohol use:  Yes    Comment: 1 - 2  weekly   Drug use: Never   Sexual activity: Not on file  Other Topics Concern   Not on file  Social History Narrative   Not on file   Social Determinants of Health   Financial Resource Strain: Not on file  Food Insecurity: Not on file  Transportation Needs: Not on file  Physical Activity: Not on file  Stress: Not on file  Social Connections: Not on file     Family History: The patient's family history includes CAD in her half-brother; Heart attack in her father; Throat cancer in her mother. ROS:   Please see the history of present illness.    All 14 point review of systems negative except as described per history of present illness  EKGs/Labs/Other Studies Reviewed:      Recent Labs: 06/21/2020: BUN 14; Creatinine, Ser 0.74; Potassium 5.1; Sodium 143  Recent Lipid Panel No results found for: CHOL, TRIG, HDL, CHOLHDL, VLDL, LDLCALC, LDLDIRECT  Physical Exam:    VS:  BP 120/72 (BP Location: Left Arm, Patient Position: Sitting)   Pulse 78   Ht 5\' 3"  (1.6 m)   Wt 147 lb (66.7 kg)   SpO2 97%   BMI 26.04 kg/m     Wt Readings from Last 3 Encounters:  05/20/21 147 lb (66.7 kg)  07/23/20 152 lb (68.9 kg)  05/07/20 156 lb (70.8 kg)     GEN:  Well nourished, well developed in no acute distress HEENT: Normal NECK: No JVD; No carotid bruits LYMPHATICS: No lymphadenopathy CARDIAC: RRR, no murmurs, no rubs, no gallops RESPIRATORY:  Clear to auscultation without rales, wheezing or rhonchi  ABDOMEN: Soft, non-tender, non-distended MUSCULOSKELETAL:  No edema; No deformity  SKIN: Warm and dry LOWER EXTREMITIES: no swelling NEUROLOGIC:  Alert and oriented x 3 PSYCHIATRIC:  Normal affect   ASSESSMENT:    1. Mixed hyperlipidemia   2. Dyspnea on exertion   3. Atypical chest pain   4. Dyslipidemia    PLAN:    In order of problems listed above:  Mixed dyslipidemia last time I did calculate her 10 years predicted risk which was borderline however  with her constant score being 0 no need to treat this only diet and exercise.  We did discuss again today basic of Mediterranean diet.  I did review K PN which show me her LDL 122 HDL 83 but this is from 05/04/2020.  We will repeat fasting lipid profile. Dyspnea on exertion doing well from that point review continue present management. Atypical chest pain: Denies having any.  Overall she is doing very well continue present management I will see her back on as-needed basis   Medication Adjustments/Labs and Tests Ordered: Current medicines are reviewed at length with the patient today.  Concerns regarding medicines are outlined above.  Orders Placed This Encounter  Procedures   EKG 12-Lead   Medication changes: No orders of the defined types were placed in this encounter.   Signed, Georgeanna Lea, MD, [facility] 05/20/2021 8:22 AM    [facility]

## 2021-05-20 NOTE — Addendum Note (Signed)
Addended by: Heywood Bene on: 05/20/2021 11:04 AM   Modules accepted: Orders

## 2021-05-22 ENCOUNTER — Other Ambulatory Visit: Payer: Self-pay

## 2021-05-22 DIAGNOSIS — R0789 Other chest pain: Secondary | ICD-10-CM

## 2021-05-22 DIAGNOSIS — R0609 Other forms of dyspnea: Secondary | ICD-10-CM

## 2021-05-22 DIAGNOSIS — E785 Hyperlipidemia, unspecified: Secondary | ICD-10-CM

## 2021-05-22 DIAGNOSIS — E782 Mixed hyperlipidemia: Secondary | ICD-10-CM

## 2021-05-23 LAB — LIPID PANEL
Chol/HDL Ratio: 2.9 ratio (ref 0.0–4.4)
Cholesterol, Total: 220 mg/dL — ABNORMAL HIGH (ref 100–199)
HDL: 77 mg/dL (ref >39)
LDL Chol Calc (NIH): 126 mg/dL — ABNORMAL HIGH (ref 0–99)
Triglycerides: 96 mg/dL (ref 0–149)
VLDL Cholesterol Cal: 17 mg/dL (ref 5–40)

## 2021-10-29 IMAGING — CT CT HEART MORP W/ CTA COR W/ SCORE W/ CA W/CM &/OR W/O CM
4 of 7 series · 8 of 20 positions shown, 9 images · non-contrast
Comparison: None.
COMPARISON: None.

Addendum:
EXAM:
OVER-READ INTERPRETATION  CT CHEST

The following report is an over-read performed by radiologist Dr.
Fabi Holton [REDACTED] on 06/28/2020. This
over-read does not include interpretation of cardiac or coronary
anatomy or pathology. The coronary calcium score/coronary CTA
interpretation by the cardiologist is attached.
CLINICAL DATA: Chest pain
Cardiac CTA
MEDICATIONS:
Sub lingual nitro. 4mg x 2
TECHNIQUE: The patient was scanned on a Siemens [REDACTED]ice scanner. Gantry
rotation speed was 250 msecs. Collimation was 0.6 mm. A 100 kV
prospective scan was triggered in the ascending thoracic aorta at
35-75% of the R-R interval. Average HR during the scan was 60 bpm.
The 3D data set was interpreted on a dedicated work station using
MPR, MIP and VRT modes. A total of 80cc of contrast was used.

[Series 6: best diast · axial · 0.39mm/px · z∈[-203,-158]mm · 2 of 336 slices shown, 3 images]
[im 112/336  vessel]
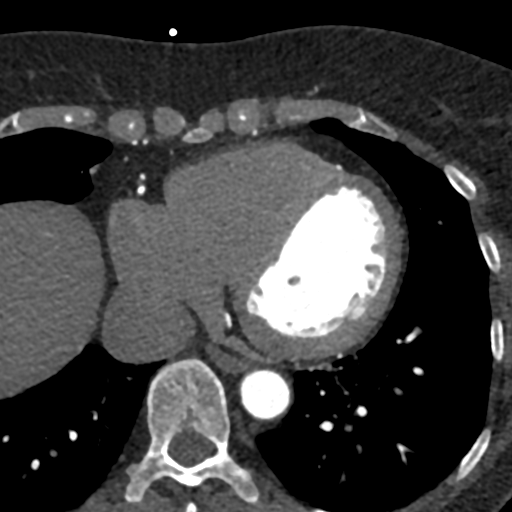
[im 112/336  lung]
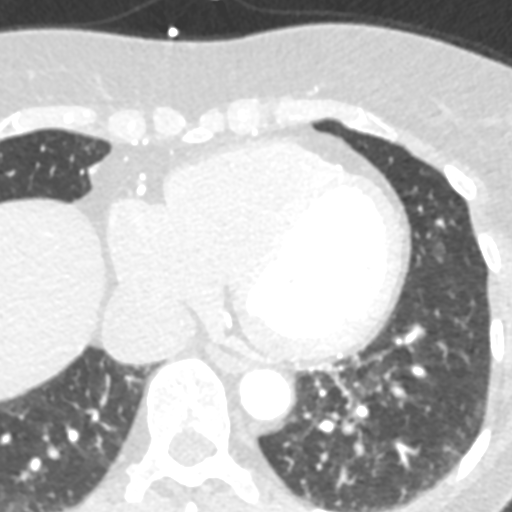
[im 224/336  vessel]
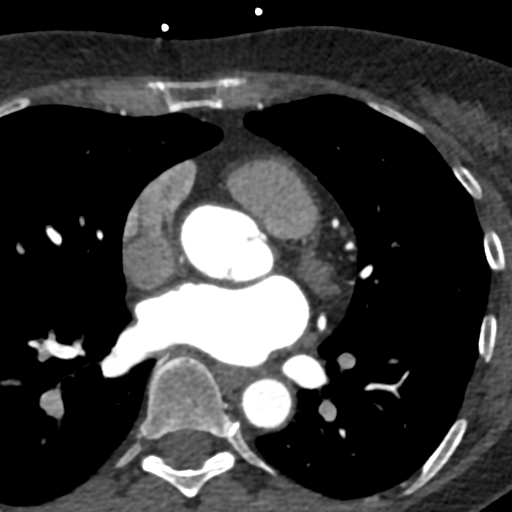

[Series 7: best syst · axial · 0.39mm/px · z∈[-203,-158]mm · 2 of 336 slices shown]
[im 112/336  vessel]
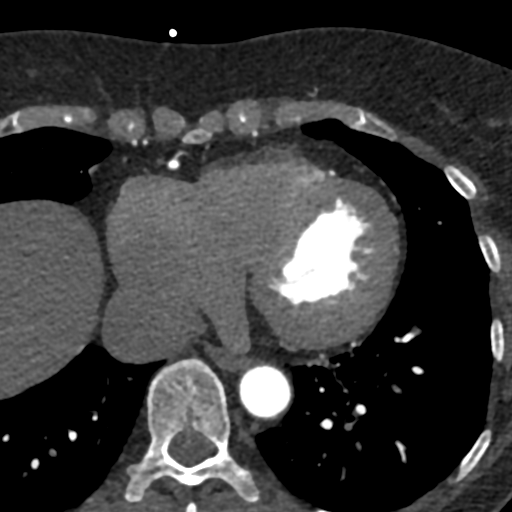
[im 224/336  vessel]
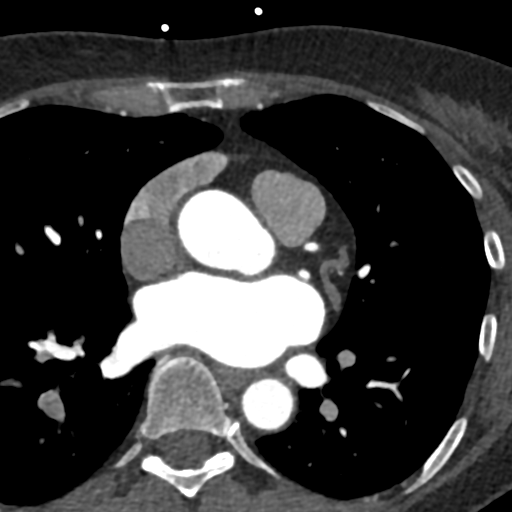

[Series 8: ts diast sharp 72 % · axial · 0.39mm/px · z∈[-203,-158]mm · 2 of 336 slices shown]
[im 112/336  lung]
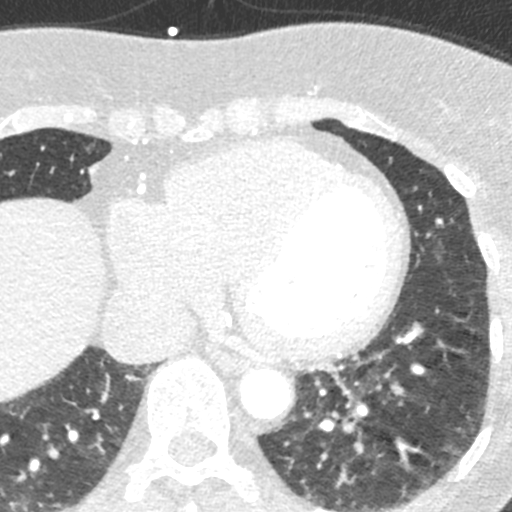
[im 224/336  lung]
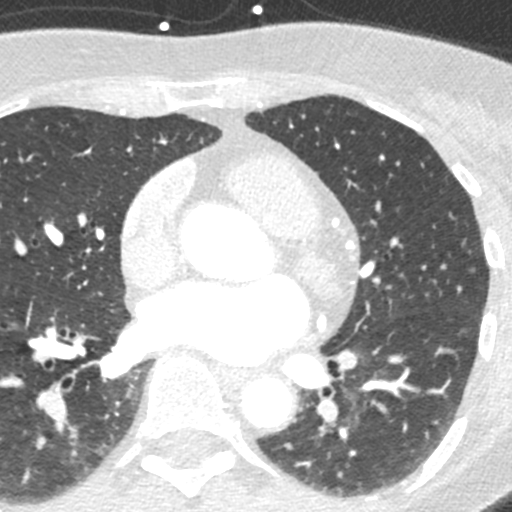

[Series 9: ts syst sharp · axial · 0.39mm/px · z∈[-203,-158]mm · 2 of 336 slices shown]
[im 112/336  lung]
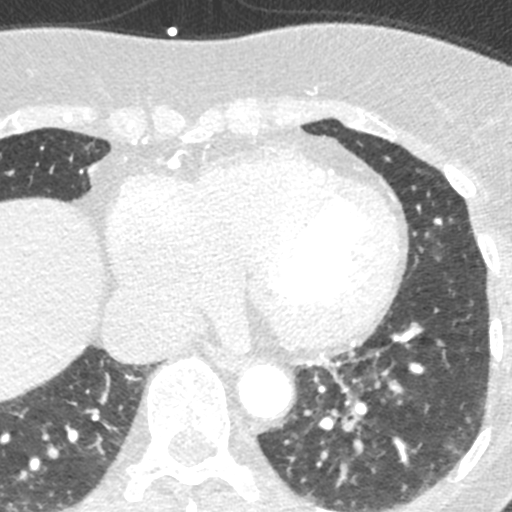
[im 224/336  lung]
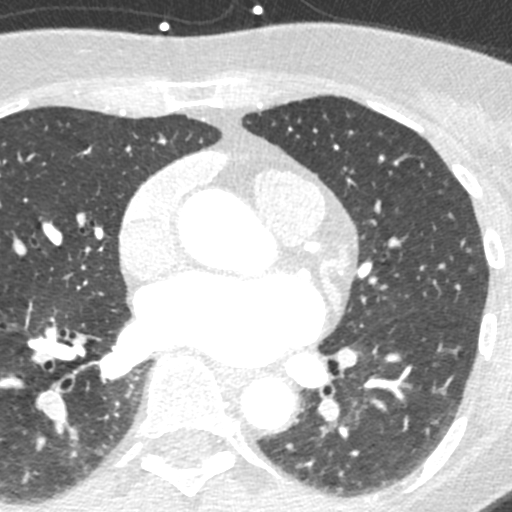

[8 of 20 positions shown; findings below may reference images not displayed]

FINDINGS: Within the visualized portions of the thorax there are no suspicious
appearing pulmonary nodules or masses, there is no acute
consolidative airspace disease, no pleural effusions, no
pneumothorax and no lymphadenopathy. Visualized portions of the
upper abdomen are unremarkable. There are no aggressive appearing
lytic or blastic lesions noted in the visualized portions of the
skeleton.
IMPRESSION: No significant incidental noncardiac findings are noted.
FINDINGS: Non-cardiac: See separate report from [REDACTED].

No LA appendage thrombus. Pulmonary veins drain normally to the left
atrium.

Calcium Score: 0 Agatston units.

Coronary Arteries: Right dominant with no anomalies

LM: No plaque or stenosis.

LAD system:  No plaque or stenosis.

Circumflex system: Small ramus. No plaque or stenosis in the LCx
system.

RCA system:  No plaque or stenosis.
IMPRESSION: No significant coronary disease noted. Coronary artery calcium score
0 Agatston units, suggesting low risk for future cardiac events.

Istaahil Papp

*** End of Addendum ***
EXAM:
OVER-READ INTERPRETATION  CT CHEST

The following report is an over-read performed by radiologist Dr.
Fabi Holton [REDACTED] on 06/28/2020. This
over-read does not include interpretation of cardiac or coronary
anatomy or pathology. The coronary calcium score/coronary CTA
interpretation by the cardiologist is attached.
FINDINGS: Within the visualized portions of the thorax there are no suspicious
appearing pulmonary nodules or masses, there is no acute
consolidative airspace disease, no pleural effusions, no
pneumothorax and no lymphadenopathy. Visualized portions of the
upper abdomen are unremarkable. There are no aggressive appearing
lytic or blastic lesions noted in the visualized portions of the
skeleton.
IMPRESSION: No significant incidental noncardiac findings are noted.

## 2022-07-24 ENCOUNTER — Other Ambulatory Visit: Payer: Self-pay | Admitting: Family Medicine

## 2022-07-24 DIAGNOSIS — M8580 Other specified disorders of bone density and structure, unspecified site: Secondary | ICD-10-CM

## 2022-07-24 DIAGNOSIS — Z1231 Encounter for screening mammogram for malignant neoplasm of breast: Secondary | ICD-10-CM

## 2022-10-01 DIAGNOSIS — E559 Vitamin D deficiency, unspecified: Secondary | ICD-10-CM | POA: Insufficient documentation

## 2022-10-01 DIAGNOSIS — R000 Tachycardia, unspecified: Secondary | ICD-10-CM

## 2022-10-01 DIAGNOSIS — M8580 Other specified disorders of bone density and structure, unspecified site: Secondary | ICD-10-CM

## 2022-10-01 DIAGNOSIS — K5792 Diverticulitis of intestine, part unspecified, without perforation or abscess without bleeding: Secondary | ICD-10-CM

## 2022-10-01 DIAGNOSIS — I4891 Unspecified atrial fibrillation: Secondary | ICD-10-CM | POA: Insufficient documentation

## 2022-10-01 DIAGNOSIS — D126 Benign neoplasm of colon, unspecified: Secondary | ICD-10-CM | POA: Insufficient documentation

## 2022-10-01 HISTORY — DX: Unspecified atrial fibrillation: I48.91

## 2022-10-01 HISTORY — DX: Benign neoplasm of colon, unspecified: D12.6

## 2022-10-01 HISTORY — DX: Diverticulitis of intestine, part unspecified, without perforation or abscess without bleeding: K57.92

## 2022-10-01 HISTORY — DX: Other specified disorders of bone density and structure, unspecified site: M85.80

## 2022-10-01 HISTORY — DX: Vitamin D deficiency, unspecified: E55.9

## 2022-11-17 ENCOUNTER — Ambulatory Visit: Payer: Medicare Other | Attending: Cardiology

## 2022-11-17 ENCOUNTER — Encounter: Payer: Self-pay | Admitting: Cardiology

## 2022-11-17 ENCOUNTER — Ambulatory Visit: Payer: Medicare Other | Attending: Cardiology | Admitting: Cardiology

## 2022-11-17 VITALS — BP 110/62 | HR 69 | Ht 63.0 in | Wt 143.0 lb

## 2022-11-17 DIAGNOSIS — E782 Mixed hyperlipidemia: Secondary | ICD-10-CM | POA: Insufficient documentation

## 2022-11-17 DIAGNOSIS — I480 Paroxysmal atrial fibrillation: Secondary | ICD-10-CM | POA: Diagnosis not present

## 2022-11-17 DIAGNOSIS — E785 Hyperlipidemia, unspecified: Secondary | ICD-10-CM | POA: Insufficient documentation

## 2022-11-17 NOTE — Progress Notes (Signed)
Cardiology Office Note:    Date:  11/17/2022   ID:  Lori, Foley 29-May-1956, MRN [id number]  PCP:  Sigmund Hazel, MD  Cardiologist:  Gypsy Balsam, MD    Referring MD: Sigmund Hazel, MD   Chief Complaint  Patient presents with   Atrial Fibrillation    History of Present Illness:    Lori Foley is a 67 y.o. female with past medical history significant for atypical chest pain.  Evaluation included calcium score which was 0, also have dyslipidemia and dyspnea on exertion, echocardiogram showed preserved left ventricle ejection fraction mild to moderate TR everything else looks okay.  Recently her husband passed, on top of that her brother also passed within 10 days apart she was very stressed out she went to her primary care physician EKG was done she was noted to be in atrial fibrillation fast ventricular rate.  Apparently EKG has been analyzed by some cardiologist I do not have that EKG she was told to have atrial fibrillation, prescription for Xarelto 20 has been given, however when she read the side effect of this medication she decided not to take it she comes today to my office to talk about it.  She was completely asymptomatic in terms of palpitations when she got episodes of atrial fibrillation she just overall felt sick.  Since that time she is doing well.  Denies have any chest pain tightness squeezing pressure burning chest again overall she seems to be doing well  Past Medical History:  Diagnosis Date   Adenomatous polyp of colon 10/01/2022   Atrial fibrillation (HCC) 10/01/2022   Atypical chest pain 05/20/2021   Coronary artery disease    Diverticulitis 10/01/2022   Dyslipidemia 05/07/2020   Dyspnea on exertion 05/07/2020   Hyperlipidemia    Osteopenia 10/01/2022   Overweight 07/19/2020   Precordial chest pain 05/07/2020   Vitamin D deficiency 10/01/2022    Past Surgical History:  Procedure Laterality Date   TONSILLECTOMY     TUMOR REMOVAL Right    RT HAND     Current Medications: Current Meds  Medication Sig   Chlorpheniramine-Acetaminophen (CORICIDIN HBP COLD/FLU PO) Take 30 mLs by mouth every 4 (four) hours as needed (Cough).   Dextromethorphan Polistirex (ROBITUSSIN 12 HOUR COUGH PO) Take 10 mLs by mouth as needed (cough).   Ergocalciferol (VITAMIN D2) 50 MCG (2000 UT) TABS Take 1 tablet by mouth 2 (two) times daily.   Magnesium 250 MG TABS Take 1 tablet by mouth daily.   Multiple Vitamins-Minerals (CENTRUM SILVER ADULT 50+) TABS Take 1 tablet by mouth daily. Unknown strength     Allergies:   Patient has no known allergies.   Social History   Socioeconomic History   Marital status: Married    Spouse name: Not on file   Number of children: Not on file   Years of education: Not on file   Highest education level: Not on file  Occupational History   Not on file  Tobacco Use   Smoking status: Never   Smokeless tobacco: Never  Vaping Use   Vaping Use: Never used  Substance and Sexual Activity   Alcohol use: Yes    Comment: 1 - 2  weekly   Drug use: Never   Sexual activity: Yes  Other Topics Concern   Not on file  Social History Narrative   Not on file   Social Determinants of Health   Financial Resource Strain: Not on file  Food Insecurity: Not on file  Transportation Needs: Not on file  Physical Activity: Not on file  Stress: Not on file  Social Connections: Not on file     Family History: The patient's family history includes CAD in her half-brother; Heart attack in her father; Throat cancer in her mother. ROS:   Please see the history of present illness.    All 14 point review of systems negative except as described per history of present illness  EKGs/Labs/Other Studies Reviewed:      Recent Labs: No results found for requested labs within last 365 days.  Recent Lipid Panel    Component Value Date/Time   CHOL 220 (H) 05/22/2021 0809   TRIG 96 05/22/2021 0809   HDL 77 05/22/2021 0809   CHOLHDL 2.9  05/22/2021 0809   LDLCALC 126 (H) 05/22/2021 0809    Physical Exam:    VS:  BP 110/62 (BP Location: Left Arm, Patient Position: Sitting)   Pulse 69   Ht 5\' 3"  (1.6 m)   Wt 143 lb (64.9 kg)   SpO2 97%   BMI 25.33 kg/m     Wt Readings from Last 3 Encounters:  11/17/22 143 lb (64.9 kg)  05/20/21 147 lb (66.7 kg)  07/23/20 152 lb (68.9 kg)     GEN:  Well nourished, well developed in no acute distress HEENT: Normal NECK: No JVD; No carotid bruits LYMPHATICS: No lymphadenopathy CARDIAC: RRR, no murmurs, no rubs, no gallops RESPIRATORY:  Clear to auscultation without rales, wheezing or rhonchi  ABDOMEN: Soft, non-tender, non-distended MUSCULOSKELETAL:  No edema; No deformity  SKIN: Warm and dry LOWER EXTREMITIES: no swelling NEUROLOGIC:  Alert and oriented x 3 PSYCHIATRIC:  Normal affect   ASSESSMENT:    1. Paroxysmal atrial fibrillation (HCC)   2. Mixed hyperlipidemia   3. Dyslipidemia    PLAN:    In order of problems listed above:  Paroxysmal atrial fibrillation only 1 episode of atrial fibrillation that happened a very stressful situation when her husband passed.  Her CHADS2 Vascor" is only 2 for being a woman more than 67 years old.  Therefore anticoagulation have borderline indications on top of that clearly she does not want to take this medications.  Therefore, we will put Zio patch to see if she gets recurrences of atrial fibrillation, I will do echocardiogram to look at the atrial size, I advised her to get Kardia device mobile so she will be able to monitor her EKG daily and let me know if she develops atrial fibrillation.  I told her if she develops atrial fibrillation she need to start taking Xarelto and let me know that something like this happened. Mixed dyslipidemia, I did review her K PN which show me LDL 126 HDL 77 we did a calculation of risk factors before we will continue conservative approach. Dyspnea on exertion.  Denies having any. Obviously she still  grieving after the passing of her husband.  Asked her to BL be more active she tells me that she is going to the beach with her family for 1 week.  That supposed to be her going with her husband obviously is not going to happen but she taking her knees with her so hopefully she will have a good time.   Medication Adjustments/Labs and Tests Ordered: Current medicines are reviewed at length with the patient today.  Concerns regarding medicines are outlined above.  No orders of the defined types were placed in this encounter.  Medication changes: No orders of the defined types were placed  in this encounter.   Signed, Georgeanna Lea, MD, [facility] 11/17/2022 9:08 AM    [facility]

## 2022-11-17 NOTE — Addendum Note (Signed)
Addended by: Baldo Ash D on: 11/17/2022 09:31 AM   Modules accepted: Orders

## 2022-11-17 NOTE — Patient Instructions (Addendum)
Medication Instructions:  Your physician recommends that you continue on your current medications as directed. Please refer to the Current Medication list given to you today.  *If you need a refill on your cardiac medications before your next appointment, please call your pharmacy*   Lab Work: None Ordered If you have labs (blood work) drawn today and your tests are completely normal, you will receive your results only by: MyChart Message (if you have MyChart) OR A paper copy in the mail If you have any lab test that is abnormal or we need to change your treatment, we will call you to review the results.   Testing/Procedures: Your physician has requested that you have an echocardiogram. Echocardiography is a painless test that uses sound waves to create images of your heart. It provides your doctor with information about the size and shape of your heart and how well your heart's chambers and valves are working. This procedure takes approximately one hour. There are no restrictions for this procedure. Please do NOT wear cologne, perfume, aftershave, or lotions (deodorant is allowed). Please arrive 15 minutes prior to your appointment time.    WHY IS MY DOCTOR PRESCRIBING ZIO? The Zio system is proven and trusted by physicians to detect and diagnose irregular heart rhythms -- and has been prescribed to hundreds of thousands of patients.  The FDA has cleared the Zio system to monitor for many different kinds of irregular heart rhythms. In a study, physicians were able to reach a diagnosis 90% of the time with the Zio system1.  You can wear the Zio monitor -- a small, discreet, comfortable patch -- during your normal day-to-day activity, including while you sleep, shower, and exercise, while it records every single heartbeat for analysis.  1Barrett, P., et al. Comparison of 24 Hour Holter Monitoring Versus 14 Day Novel Adhesive Patch Electrocardiographic Monitoring. American Journal of  Medicine, 2014.  ZIO VS. HOLTER MONITORING The Zio monitor can be comfortably worn for up to 14 days. Holter monitors can be worn for 24 to 48 hours, limiting the time to record any irregular heart rhythms you may have. Zio is able to capture data for the 51% of patients who have their first symptom-triggered arrhythmia after 48 hours.1  LIVE WITHOUT RESTRICTIONS The Zio ambulatory cardiac monitor is a small, unobtrusive, and water-resistant patch--you might even forget you're wearing it. The Zio monitor records and stores every beat of your heart, whether you're sleeping, working out, or showering.    Kardia   Follow-Up: At [facility], you and your health needs are our priority.  As part of our continuing mission to provide you with exceptional heart care, we have created designated Provider Care Teams.  These Care Teams include your primary Cardiologist (physician) and Advanced Practice Providers (APPs -  Physician Assistants and Nurse Practitioners) who all work together to provide you with the care you need, when you need it.  We recommend signing up for the patient portal called "MyChart".  Sign up information is provided on this After Visit Summary.  MyChart is used to connect with patients for Virtual Visits (Telemedicine).  Patients are able to view lab/test results, encounter notes, upcoming appointments, etc.  Non-urgent messages can be sent to your provider as well.   To learn more about what you can do with MyChart, go to [email or web address].    Your next appointment:   4 month(s)  The format for your next appointment:   In Person  Provider:   Gypsy Balsam, MD  Other Instructions NA  

## 2022-12-02 DIAGNOSIS — I480 Paroxysmal atrial fibrillation: Secondary | ICD-10-CM | POA: Diagnosis not present

## 2022-12-10 ENCOUNTER — Ambulatory Visit (HOSPITAL_BASED_OUTPATIENT_CLINIC_OR_DEPARTMENT_OTHER): Payer: Medicare Other

## 2022-12-24 ENCOUNTER — Ambulatory Visit (HOSPITAL_BASED_OUTPATIENT_CLINIC_OR_DEPARTMENT_OTHER): Admission: RE | Admit: 2022-12-24 | Payer: Medicare Other | Source: Ambulatory Visit

## 2022-12-31 ENCOUNTER — Telehealth: Payer: Self-pay

## 2022-12-31 NOTE — Telephone Encounter (Signed)
Results reviewed with pt as per Dr. Krasowski's note.  Pt verbalized understanding and had no additional questions. Routed to PCP  

## 2023-01-14 ENCOUNTER — Ambulatory Visit (HOSPITAL_BASED_OUTPATIENT_CLINIC_OR_DEPARTMENT_OTHER)
Admission: RE | Admit: 2023-01-14 | Discharge: 2023-01-14 | Disposition: A | Discharge status: Home / Self Care | Payer: Medicare Other | Source: Ambulatory Visit | Attending: Cardiology | Admitting: Cardiology

## 2023-01-14 DIAGNOSIS — I480 Paroxysmal atrial fibrillation: Secondary | ICD-10-CM

## 2023-01-14 LAB — ECHOCARDIOGRAM COMPLETE
Area-P 1/2: 3.85 cm2
S' Lateral: 2.6 cm

## 2023-01-16 ENCOUNTER — Telehealth: Payer: Self-pay

## 2023-01-16 NOTE — Telephone Encounter (Signed)
Left message on My Chart with normal Echo results per Dr. Krasowski's note. Routed to PCP.  

## 2023-03-25 ENCOUNTER — Encounter: Payer: Self-pay | Admitting: Cardiology

## 2023-03-25 ENCOUNTER — Ambulatory Visit: Payer: Medicare Other | Attending: Cardiology | Admitting: Cardiology

## 2023-03-25 VITALS — BP 130/62 | HR 68 | Ht 63.0 in | Wt 142.0 lb

## 2023-03-25 DIAGNOSIS — I480 Paroxysmal atrial fibrillation: Secondary | ICD-10-CM | POA: Insufficient documentation

## 2023-03-25 DIAGNOSIS — R0789 Other chest pain: Secondary | ICD-10-CM | POA: Insufficient documentation

## 2023-03-25 DIAGNOSIS — R0609 Other forms of dyspnea: Secondary | ICD-10-CM | POA: Insufficient documentation

## 2023-03-25 DIAGNOSIS — E785 Hyperlipidemia, unspecified: Secondary | ICD-10-CM | POA: Insufficient documentation

## 2023-03-25 NOTE — Patient Instructions (Signed)
Medication Instructions:  Your physician recommends that you continue on your current medications as directed. Please refer to the Current Medication list given to you today.  *If you need a refill on your cardiac medications before your next appointment, please call your pharmacy*   Lab Work: 3rd Floor   [address]  Your physician recommends that you return for lab work in:  when fasting You need to have labs done when you are fasting.  You can come Monday through Friday 8:00 am to 11:30AM and 1:00 to 4:00. You do not need to make an appointment as the order has already been placed.     Testing/Procedures: None Ordered   Follow-Up: At [facility], you and your health needs are our priority.  As part of our continuing mission to provide you with exceptional heart care, we have created designated Provider Care Teams.  These Care Teams include your primary Cardiologist (physician) and Advanced Practice Providers (APPs -  Physician Assistants and Nurse Practitioners) who all work together to provide you with the care you need, when you need it.  We recommend signing up for the patient portal called "MyChart".  Sign up information is provided on this After Visit Summary.  MyChart is used to connect with patients for Virtual Visits (Telemedicine).  Patients are able to view lab/test results, encounter notes, upcoming appointments, etc.  Non-urgent messages can be sent to your provider as well.   To learn more about what you can do with MyChart, go to [email or web address].    Your next appointment:   6 month(s)  The format for your next appointment:   In Person  Provider:   Gypsy Balsam, MD    Other Instructions NA

## 2023-03-25 NOTE — Progress Notes (Signed)
Cardiology Office Note:    Date:  03/25/2023   ID:  LAVIDA Foley, DOB 23-Mar-1956, MRN [id number]  PCP:  Sigmund Hazel, MD  Cardiologist:  Gypsy Balsam, MD    Referring MD: Sigmund Hazel, MD   Chief Complaint  Patient presents with   Follow-up  Doing fine  History of Present Illness:    Lori Foley is a 67 y.o. female past medical history significant for atypical chest pain, evaluation included coronary calcium score which was 0 additional problem include dyslipidemia, dyspnea on exertion, echocardiogram showed preserved left ventricle ejection fraction mild to moderate TR.  Last time I seen her which was seen in May she recently lost her husband and her brother who passed away 10 days apart obviously she was very stressed about it she went to her primary care physician she was found to be in atrial fibrillation with fast ventricular rate.  She was put on Xarelto and referred to [place].  She converted to sinus rhythm and she did not want to take Xarelto her CHADS2 Vascor equals 2 for being a woman and more than 67 years old.  Since that time she is doing fine she did not feel her episode of atrial fibrillation so advised her to get Kardia mobile and check EKG every single day and look for any evidence of atrial fibrillation.  She has not done it yet because she was under impression that cardiomyopathy does not work with an android phone.  I explained to her that it will work and she should get it.  She is trying to exercise on the regular basis she has a treadmill she walks on the regular basis and does some weightlifting on the machine.  Past Medical History:  Diagnosis Date   Adenomatous polyp of colon 10/01/2022   Atrial fibrillation (HCC) 10/01/2022   Atypical chest pain 05/20/2021   Coronary artery disease    Diverticulitis 10/01/2022   Dyslipidemia 05/07/2020   Dyspnea on exertion 05/07/2020   Hyperlipidemia    Osteopenia 10/01/2022   Overweight 07/19/2020   Precordial chest pain  05/07/2020   Vitamin D deficiency 10/01/2022    Past Surgical History:  Procedure Laterality Date   TONSILLECTOMY     TUMOR REMOVAL Right    RT HAND    Current Medications: Current Meds  Medication Sig   Chlorpheniramine-Acetaminophen (CORICIDIN HBP COLD/FLU PO) Take 30 mLs by mouth every 4 (four) hours as needed (Cough).   Dextromethorphan Polistirex (ROBITUSSIN 12 HOUR COUGH PO) Take 10 mLs by mouth as needed (cough).   Ergocalciferol (VITAMIN D2) 50 MCG (2000 UT) TABS Take 1 tablet by mouth 2 (two) times daily.   Magnesium 250 MG TABS Take 1 tablet by mouth daily.   Multiple Vitamins-Minerals (CENTRUM SILVER ADULT 50+) TABS Take 1 tablet by mouth daily. Unknown strength     Allergies:   Patient has no known allergies.   Social History   Socioeconomic History   Marital status: Widowed    Spouse name: Not on file   Number of children: Not on file   Years of education: Not on file   Highest education level: Not on file  Occupational History   Not on file  Tobacco Use   Smoking status: Never   Smokeless tobacco: Never  Vaping Use   Vaping status: Never Used  Substance and Sexual Activity   Alcohol use: Yes    Comment: 1 - 2  weekly   Drug use: Never   Sexual activity: Yes  Other Topics Concern   Not on file  Social History Narrative   Not on file   Social Determinants of Health   Financial Resource Strain: Not on file  Food Insecurity: Not on file  Transportation Needs: Not on file  Physical Activity: Not on file  Stress: Not on file  Social Connections: Not on file     Family History: The patient's family history includes CAD in her half-brother; Heart attack in her father; Throat cancer in her mother. ROS:   Please see the history of present illness.    All 14 point review of systems negative except as described per history of present illness  EKGs/Labs/Other Studies Reviewed:         Recent Labs: No results found for requested labs within last  365 days.  Recent Lipid Panel    Component Value Date/Time   CHOL 220 (H) 05/22/2021 0809   TRIG 96 05/22/2021 0809   HDL 77 05/22/2021 0809   CHOLHDL 2.9 05/22/2021 0809   LDLCALC 126 (H) 05/22/2021 0809    Physical Exam:    VS:  BP 130/62 (BP Location: Left Arm, Patient Position: Sitting)   Pulse 68   Ht 5\' 3"  (1.6 m)   Wt 142 lb (64.4 kg)   SpO2 98%   BMI 25.15 kg/m     Wt Readings from Last 3 Encounters:  03/25/23 142 lb (64.4 kg)  11/17/22 143 lb (64.9 kg)  05/20/21 147 lb (66.7 kg)     GEN:  Well nourished, well developed in no acute distress HEENT: Normal NECK: No JVD; No carotid bruits LYMPHATICS: No lymphadenopathy CARDIAC: RRR, no murmurs, no rubs, no gallops RESPIRATORY:  Clear to auscultation without rales, wheezing or rhonchi  ABDOMEN: Soft, non-tender, non-distended MUSCULOSKELETAL:  No edema; No deformity  SKIN: Warm and dry LOWER EXTREMITIES: no swelling NEUROLOGIC:  Alert and oriented x 3 PSYCHIATRIC:  Normal affect   ASSESSMENT:    1. Paroxysmal atrial fibrillation (HCC)   2. Atypical chest pain   3. Dyslipidemia   4. Dyspnea on exertion    PLAN:    In order of problems listed above:  Paroxysmal atrial fibrillation, denies have any palpitations.  Advised to get Kardia mobile, CHADS2 Vascor equals only 2.  She does not want to take anticoagulation so we will stay away from it. Dyslipidemia I did review K PN which show me data from 2022 with LDL of 526 HDL 77 we will recheck her cholesterol but with her calcium score being 0 I doubt we will have indications for treatment. Dyspnea on exertion denies having any trying to be more active. Atypical chest pain denies having any   Medication Adjustments/Labs and Tests Ordered: Current medicines are reviewed at length with the patient today.  Concerns regarding medicines are outlined above.  No orders of the defined types were placed in this encounter.  Medication changes: No orders of the defined  types were placed in this encounter.   Signed, Georgeanna Lea, MD, [facility] 03/25/2023 8:21 AM    [facility]

## 2023-03-27 ENCOUNTER — Encounter: Payer: Self-pay | Admitting: Cardiology

## 2023-03-27 LAB — LIPID PANEL
Chol/HDL Ratio: 3.4 ratio (ref 0.0–4.4)
Cholesterol, Total: 243 mg/dL — ABNORMAL HIGH (ref 100–199)
HDL: 72 mg/dL (ref >39)
LDL Chol Calc (NIH): 145 mg/dL — ABNORMAL HIGH (ref 0–99)
Triglycerides: 149 mg/dL (ref 0–149)
VLDL Cholesterol Cal: 26 mg/dL (ref 5–40)

## 2023-04-15 ENCOUNTER — Ambulatory Visit
Admission: RE | Admit: 2023-04-15 | Discharge: 2023-04-15 | Disposition: A | Discharge status: Home / Self Care | Payer: Medicare Other | Source: Ambulatory Visit | Attending: Family Medicine | Admitting: Family Medicine

## 2023-04-15 DIAGNOSIS — Z1231 Encounter for screening mammogram for malignant neoplasm of breast: Secondary | ICD-10-CM

## 2023-04-15 DIAGNOSIS — M8580 Other specified disorders of bone density and structure, unspecified site: Secondary | ICD-10-CM

## 2024-06-17 ENCOUNTER — Other Ambulatory Visit: Payer: Self-pay | Admitting: Family Medicine

## 2024-06-17 DIAGNOSIS — Z1231 Encounter for screening mammogram for malignant neoplasm of breast: Secondary | ICD-10-CM

## 2024-07-11 ENCOUNTER — Other Ambulatory Visit: Payer: Self-pay | Admitting: Medical Genetics

## 2024-07-15 ENCOUNTER — Ambulatory Visit
Admission: RE | Admit: 2024-07-15 | Discharge: 2024-07-15 | Disposition: A | Discharge status: Home / Self Care | Source: Ambulatory Visit | Attending: Family Medicine | Admitting: Family Medicine

## 2024-07-15 DIAGNOSIS — Z1231 Encounter for screening mammogram for malignant neoplasm of breast: Secondary | ICD-10-CM

## 2024-08-18 ENCOUNTER — Other Ambulatory Visit: Payer: Self-pay | Admitting: Medical Genetics

## 2024-08-18 DIAGNOSIS — Z006 Encounter for examination for normal comparison and control in clinical research program: Secondary | ICD-10-CM

## 2024-08-23 ENCOUNTER — Other Ambulatory Visit: Payer: Self-pay
